# Patient Record
Sex: Male | Born: 1990 | Race: White | Hispanic: No | Smoking: Current every day smoker
Health system: Southern US, Community
[De-identification: ages and names within clinical notes are randomized; demographics above are authoritative.]

## PROBLEM LIST (undated history)

## (undated) HISTORY — PX: ANKLE FRACTURE SURGERY: SHX122

---

## 2020-10-27 ENCOUNTER — Emergency Department (HOSPITAL_COMMUNITY)
Admission: EM | Admit: 2020-10-27 | Discharge: 2020-10-28 | Disposition: A | Discharge status: Home / Self Care | Payer: Self-pay | Attending: Emergency Medicine | Admitting: Emergency Medicine

## 2020-10-27 ENCOUNTER — Emergency Department (HOSPITAL_COMMUNITY): Payer: Self-pay

## 2020-10-27 DIAGNOSIS — Y908 Blood alcohol level of 240 mg/100 ml or more: Secondary | ICD-10-CM | POA: Insufficient documentation

## 2020-10-27 DIAGNOSIS — S61422A Laceration with foreign body of left hand, initial encounter: Secondary | ICD-10-CM | POA: Insufficient documentation

## 2020-10-27 DIAGNOSIS — T24232A Burn of second degree of left lower leg, initial encounter: Secondary | ICD-10-CM

## 2020-10-27 DIAGNOSIS — S92902A Unspecified fracture of left foot, initial encounter for closed fracture: Secondary | ICD-10-CM | POA: Insufficient documentation

## 2020-10-27 DIAGNOSIS — T07XXXA Unspecified multiple injuries, initial encounter: Secondary | ICD-10-CM | POA: Insufficient documentation

## 2020-10-27 DIAGNOSIS — Z20822 Contact with and (suspected) exposure to covid-19: Secondary | ICD-10-CM | POA: Insufficient documentation

## 2020-10-27 DIAGNOSIS — F10929 Alcohol use, unspecified with intoxication, unspecified: Secondary | ICD-10-CM | POA: Insufficient documentation

## 2020-10-27 DIAGNOSIS — R079 Chest pain, unspecified: Secondary | ICD-10-CM | POA: Insufficient documentation

## 2020-10-27 DIAGNOSIS — Z23 Encounter for immunization: Secondary | ICD-10-CM | POA: Insufficient documentation

## 2020-10-27 MED ORDER — SODIUM CHLORIDE 0.9 % IV SOLN: INTRAVENOUS | Status: DC

## 2020-10-27 MED ORDER — SODIUM CHLORIDE 0.9 % IV BOLUS
1000.0000 mL | Freq: Once | INTRAVENOUS | Status: AC
  Administered 2020-10-28: 1000 mL via INTRAVENOUS

## 2020-10-27 MED ORDER — MIDAZOLAM HCL 2 MG/2ML IJ SOLN
2.0000 mg | Freq: Once | INTRAMUSCULAR | Status: AC
  Administered 2020-10-28: 2 mg via INTRAVENOUS
  Filled 2020-10-27: qty 2

## 2020-10-27 MED ORDER — TETANUS-DIPHTH-ACELL PERTUSSIS 5-2.5-18.5 LF-MCG/0.5 IM SUSY
0.5000 mL | PREFILLED_SYRINGE | Freq: Once | INTRAMUSCULAR | Status: AC
  Administered 2020-10-28: 0.5 mL via INTRAMUSCULAR
  Filled 2020-10-27: qty 0.5
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
## 2020-10-28 ENCOUNTER — Emergency Department (HOSPITAL_COMMUNITY): Payer: Self-pay

## 2020-10-28 ENCOUNTER — Encounter (HOSPITAL_COMMUNITY): Payer: Self-pay | Admitting: Emergency Medicine

## 2020-10-28 LAB — CBC
HCT: 41.7 % (ref 39.0–52.0)
Hemoglobin: 14.4 g/dL (ref 13.0–17.0)
MCH: 30.1 pg (ref 26.0–34.0)
MCHC: 34.5 g/dL (ref 30.0–36.0)
MCV: 87.1 fL (ref 80.0–100.0)
Platelets: 317 10*3/uL (ref 150–400)
RBC: 4.79 MIL/uL (ref 4.22–5.81)
RDW: 13 % (ref 11.5–15.5)
WBC: 16.2 10*3/uL — ABNORMAL HIGH (ref 4.0–10.5)
nRBC: 0 % (ref 0.0–0.2)

## 2020-10-28 LAB — COMPREHENSIVE METABOLIC PANEL
ALT: 137 U/L — ABNORMAL HIGH (ref 0–44)
AST: 127 U/L — ABNORMAL HIGH (ref 15–41)
Albumin: 4.2 g/dL (ref 3.5–5.0)
Alkaline Phosphatase: 47 U/L (ref 38–126)
Anion gap: 12 (ref 5–15)
BUN: 12 mg/dL (ref 6–20)
CO2: 22 mmol/L (ref 22–32)
Calcium: 8.8 mg/dL — ABNORMAL LOW (ref 8.9–10.3)
Chloride: 108 mmol/L (ref 98–111)
Creatinine, Ser: 1.1 mg/dL (ref 0.61–1.24)
GFR, Estimated: >60 mL/min (ref >60)
Glucose, Bld: 117 mg/dL — ABNORMAL HIGH (ref 70–99)
Potassium: 3.3 mmol/L — ABNORMAL LOW (ref 3.5–5.1)
Sodium: 142 mmol/L (ref 135–145)
Total Bilirubin: 0.4 mg/dL (ref 0.3–1.2)
Total Protein: 7.5 g/dL (ref 6.5–8.1)

## 2020-10-28 LAB — I-STAT CHEM 8, ED
BUN: 12 mg/dL (ref 6–20)
Calcium, Ion: 1.08 mmol/L — ABNORMAL LOW (ref 1.15–1.40)
Chloride: 106 mmol/L (ref 98–111)
Creatinine, Ser: 1.4 mg/dL — ABNORMAL HIGH (ref 0.61–1.24)
Glucose, Bld: 113 mg/dL — ABNORMAL HIGH (ref 70–99)
HCT: 42 % (ref 39.0–52.0)
Hemoglobin: 14.3 g/dL (ref 13.0–17.0)
Potassium: 3.1 mmol/L — ABNORMAL LOW (ref 3.5–5.1)
Sodium: 144 mmol/L (ref 135–145)
TCO2: 23 mmol/L (ref 22–32)

## 2020-10-28 LAB — ETHANOL: Alcohol, Ethyl (B): 250 mg/dL — ABNORMAL HIGH (ref <10)

## 2020-10-28 LAB — PROTIME-INR
INR: 1.1 (ref 0.8–1.2)
Prothrombin Time: 13.9 seconds (ref 11.4–15.2)

## 2020-10-28 LAB — RESP PANEL BY RT-PCR (FLU A&B, COVID) ARPGX2
Influenza A by PCR: NEGATIVE
Influenza B by PCR: NEGATIVE
SARS Coronavirus 2 by RT PCR: NEGATIVE

## 2020-10-28 MED ORDER — CEPHALEXIN 250 MG PO CAPS
500.0000 mg | ORAL_CAPSULE | Freq: Once | ORAL | Status: AC
  Administered 2020-10-28: 500 mg via ORAL
  Filled 2020-10-28: qty 2

## 2020-10-28 MED ORDER — ACETAMINOPHEN 500 MG PO TABS
1000.0000 mg | ORAL_TABLET | Freq: Once | ORAL | Status: AC
  Administered 2020-10-28: 1000 mg via ORAL
  Filled 2020-10-28: qty 2

## 2020-10-28 MED ORDER — CEPHALEXIN 500 MG PO CAPS: 500.0000 mg | ORAL_CAPSULE | Freq: Three times a day (TID) | ORAL | 0 refills | Status: AC

## 2020-10-28 MED ORDER — IOHEXOL 350 MG/ML SOLN
100.0000 mL | Freq: Once | INTRAVENOUS | Status: AC | PRN
  Administered 2020-10-28: 100 mL via INTRAVENOUS

## 2020-10-28 MED ORDER — LORAZEPAM 2 MG/ML IJ SOLN
1.0000 mg | Freq: Once | INTRAMUSCULAR | Status: AC
  Administered 2020-10-28: 1 mg via INTRAVENOUS
  Filled 2020-10-28: qty 1

## 2020-10-28 MED ORDER — HALOPERIDOL LACTATE 5 MG/ML IJ SOLN
5.0000 mg | Freq: Once | INTRAMUSCULAR | Status: AC
  Administered 2020-10-28: 5 mg via INTRAVENOUS
  Filled 2020-10-28: qty 1

## 2020-10-28 MED ORDER — LIDOCAINE-EPINEPHRINE (PF) 2 %-1:200000 IJ SOLN
20.0000 mL | Freq: Once | INTRAMUSCULAR | Status: AC
  Administered 2020-10-28: 20 mL via INTRADERMAL
  Filled 2020-10-28: qty 20

## 2020-10-28 MED ORDER — KETOROLAC TROMETHAMINE 15 MG/ML IJ SOLN
7.5000 mg | Freq: Once | INTRAMUSCULAR | Status: AC
  Administered 2020-10-28: 7.5 mg via INTRAVENOUS
  Filled 2020-10-28: qty 1

## 2020-10-28 NOTE — ED Notes (Signed)
CT begun

## 2020-10-28 NOTE — ED Notes (Signed)
EDP at bedside for suture process
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
## 2020-10-28 NOTE — ED Notes (Signed)
Pt found attempting to stand at the foot edge of his bed again, confused and demanding to leave the hospital. Assisted back into bed by ED clinical staff.

## 2020-10-28 NOTE — ED Notes (Signed)
Restraints removed at this time. Pt calm and cooperative. Pt using phone to contact a ride.

## 2020-10-28 NOTE — Progress Notes (Signed)
Orthopedic Tech Progress Note Patient Details:  Garrett [name] [birth year] [id number]  Ortho Devices Type of Ortho Device: [name] (short leg) splint, Stirrup splint Ortho Device/Splint Location: lle Ortho Device/Splint Interventions: Ordered, Application, Adjustment  I had assistance from the RN. We dressed the wounds on his foot with xeroform and nonstick dressing. The patient was kicking and swinging his leg the whole time. [name] Interventions Patient Tolerated: Well Instructions Provided: Care of device, Adjustment of device  Garrett [name] 10/28/2020, 5:29 AM

## 2020-10-28 NOTE — ED Notes (Signed)
Patient Alert and oriented to baseline. Stable and ambulatory to baseline. Patient verbalized understanding of the discharge instructions.  Patient belongings were taken by the patient.   
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
## 2020-10-28 NOTE — ED Notes (Signed)
Pt found standing at the edge of his bed despite numerous reminders to remain in bed. Assisted back into bed by ED staff.
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
## 2020-10-28 NOTE — ED Notes (Signed)
Pt very agitated, sitting up in bed despite repeated instruction to continue laying flat for the time being. Stated to this RN and ED Provider, "I am telling you right now, do not f*cking touch me."
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
## 2020-10-28 NOTE — ED Notes (Signed)
RN called ortho technician for leg splint

## 2020-10-30 ENCOUNTER — Ambulatory Visit (HOSPITAL_BASED_OUTPATIENT_CLINIC_OR_DEPARTMENT_OTHER)
Admission: RE | Admit: 2020-10-30 | Discharge: 2020-10-30 | Disposition: A | Discharge status: Home / Self Care | Payer: Self-pay | Source: Ambulatory Visit | Attending: Orthopaedic Surgery | Admitting: Orthopaedic Surgery

## 2020-10-30 ENCOUNTER — Other Ambulatory Visit (HOSPITAL_BASED_OUTPATIENT_CLINIC_OR_DEPARTMENT_OTHER): Payer: Self-pay

## 2020-10-30 ENCOUNTER — Ambulatory Visit (INDEPENDENT_AMBULATORY_CARE_PROVIDER_SITE_OTHER): Payer: Self-pay | Admitting: Orthopaedic Surgery

## 2020-10-30 ENCOUNTER — Other Ambulatory Visit: Payer: Self-pay

## 2020-10-30 VITALS — Ht 68.0 in | Wt 190.0 lb

## 2020-10-30 DIAGNOSIS — M25572 Pain in left ankle and joints of left foot: Secondary | ICD-10-CM | POA: Insufficient documentation

## 2020-10-30 MED ORDER — OXYCODONE HCL 5 MG PO CAPS: 5.0000 mg | ORAL_CAPSULE | ORAL | 0 refills | Status: DC | PRN

## 2020-10-30 MED ORDER — OXYCODONE HCL 5 MG PO TABS
5.0000 mg | ORAL_TABLET | ORAL | 0 refills | Status: DC | PRN
  Filled 2020-10-30: qty 20, 4d supply, fill #0

## 2020-10-30 NOTE — Addendum Note (Signed)
Addended by: Benancio Deeds on: 10/30/2020 05:37 PM   Modules accepted: Orders
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
## 2020-10-31 ENCOUNTER — Telehealth: Payer: Self-pay | Admitting: Orthopaedic Surgery

## 2020-10-31 NOTE — Telephone Encounter (Signed)
Please provide work status and work note. Disability forms received. [employer] will need work note to complete forms. Thanks.

## 2020-11-01 ENCOUNTER — Telehealth: Payer: Self-pay | Admitting: Orthopaedic Surgery

## 2020-11-01 NOTE — Telephone Encounter (Signed)
Patient called asked if he can be called with the results of his CT scan? Patient said he does not have anyway to come into the facility to get his results. The number to contact patient is [phone]

## 2020-11-02 ENCOUNTER — Telehealth: Payer: Self-pay | Admitting: Orthopaedic Surgery

## 2020-11-02 ENCOUNTER — Other Ambulatory Visit (HOSPITAL_BASED_OUTPATIENT_CLINIC_OR_DEPARTMENT_OTHER): Payer: Self-pay | Admitting: Orthopaedic Surgery

## 2020-11-02 ENCOUNTER — Other Ambulatory Visit (HOSPITAL_BASED_OUTPATIENT_CLINIC_OR_DEPARTMENT_OTHER): Payer: Self-pay

## 2020-11-02 MED ORDER — OXYCODONE HCL 5 MG PO TABS
5.0000 mg | ORAL_TABLET | ORAL | 0 refills | Status: AC | PRN
  Filled 2020-11-02: qty 20, 4d supply, fill #0

## 2020-11-02 NOTE — Telephone Encounter (Signed)
Patient called. He would like a refill on oxycodone called in. His call back number is [phone]

## 2020-11-08 ENCOUNTER — Telehealth: Payer: Self-pay | Admitting: Orthopaedic Surgery

## 2020-11-08 ENCOUNTER — Other Ambulatory Visit (HOSPITAL_BASED_OUTPATIENT_CLINIC_OR_DEPARTMENT_OTHER): Payer: Self-pay | Admitting: Orthopaedic Surgery

## 2020-11-08 ENCOUNTER — Other Ambulatory Visit (HOSPITAL_BASED_OUTPATIENT_CLINIC_OR_DEPARTMENT_OTHER): Payer: Self-pay

## 2020-11-08 MED ORDER — MELOXICAM 15 MG PO TABS
15.0000 mg | ORAL_TABLET | Freq: Every day | ORAL | 3 refills | Status: AC
  Filled 2020-11-08: qty 30, 30d supply, fill #0

## 2020-11-08 NOTE — Telephone Encounter (Signed)
Pt called asking for a refill of his oxycodone rx 5 mg.   [phone]

## 2020-11-09 ENCOUNTER — Telehealth: Payer: Self-pay | Admitting: Orthopaedic Surgery

## 2020-11-09 NOTE — Telephone Encounter (Signed)
Pt submitted medical release form, short term disability forms, and $25.00 money order. Accepted 11/09/2020

## 2020-11-16 ENCOUNTER — Other Ambulatory Visit: Payer: Self-pay

## 2020-11-16 ENCOUNTER — Ambulatory Visit (INDEPENDENT_AMBULATORY_CARE_PROVIDER_SITE_OTHER): Payer: Self-pay | Admitting: Orthopaedic Surgery

## 2020-11-16 ENCOUNTER — Other Ambulatory Visit (HOSPITAL_BASED_OUTPATIENT_CLINIC_OR_DEPARTMENT_OTHER): Payer: Self-pay | Admitting: Orthopaedic Surgery

## 2020-11-16 DIAGNOSIS — S93325A Dislocation of tarsometatarsal joint of left foot, initial encounter: Secondary | ICD-10-CM

## 2020-11-16 DIAGNOSIS — M25572 Pain in left ankle and joints of left foot: Secondary | ICD-10-CM
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
## 2020-11-19 ENCOUNTER — Other Ambulatory Visit (HOSPITAL_BASED_OUTPATIENT_CLINIC_OR_DEPARTMENT_OTHER): Payer: Self-pay

## 2020-12-14 ENCOUNTER — Ambulatory Visit (HOSPITAL_BASED_OUTPATIENT_CLINIC_OR_DEPARTMENT_OTHER): Payer: Self-pay | Admitting: Orthopaedic Surgery

## 2020-12-14 ENCOUNTER — Other Ambulatory Visit (HOSPITAL_BASED_OUTPATIENT_CLINIC_OR_DEPARTMENT_OTHER): Payer: Self-pay | Admitting: Orthopaedic Surgery

## 2020-12-14 DIAGNOSIS — M25572 Pain in left ankle and joints of left foot: Secondary | ICD-10-CM

## 2020-12-18 ENCOUNTER — Ambulatory Visit (HOSPITAL_BASED_OUTPATIENT_CLINIC_OR_DEPARTMENT_OTHER): Payer: Self-pay | Admitting: Orthopaedic Surgery

## 2020-12-20 ENCOUNTER — Ambulatory Visit (INDEPENDENT_AMBULATORY_CARE_PROVIDER_SITE_OTHER): Payer: Self-pay | Admitting: Orthopaedic Surgery

## 2020-12-20 ENCOUNTER — Other Ambulatory Visit: Payer: Self-pay

## 2020-12-20 ENCOUNTER — Ambulatory Visit (HOSPITAL_BASED_OUTPATIENT_CLINIC_OR_DEPARTMENT_OTHER)
Admission: RE | Admit: 2020-12-20 | Discharge: 2020-12-20 | Disposition: A | Discharge status: Home / Self Care | Payer: Self-pay | Source: Ambulatory Visit | Attending: Orthopaedic Surgery | Admitting: Orthopaedic Surgery

## 2020-12-20 ENCOUNTER — Other Ambulatory Visit (HOSPITAL_BASED_OUTPATIENT_CLINIC_OR_DEPARTMENT_OTHER): Payer: Self-pay

## 2020-12-20 DIAGNOSIS — M25572 Pain in left ankle and joints of left foot: Secondary | ICD-10-CM | POA: Insufficient documentation

## 2020-12-20 DIAGNOSIS — S93325A Dislocation of tarsometatarsal joint of left foot, initial encounter: Secondary | ICD-10-CM

## 2020-12-20 MED ORDER — SILVER SULFADIAZINE 1 % EX CREA
1.0000 "application " | TOPICAL_CREAM | Freq: Every day | CUTANEOUS | 0 refills | Status: AC
  Filled 2020-12-20: qty 25, 5d supply, fill #0
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
## 2021-03-07 ENCOUNTER — Encounter (HOSPITAL_BASED_OUTPATIENT_CLINIC_OR_DEPARTMENT_OTHER): Payer: Self-pay | Admitting: Emergency Medicine

## 2021-03-07 ENCOUNTER — Other Ambulatory Visit (HOSPITAL_BASED_OUTPATIENT_CLINIC_OR_DEPARTMENT_OTHER): Payer: Self-pay

## 2021-03-07 ENCOUNTER — Emergency Department (HOSPITAL_BASED_OUTPATIENT_CLINIC_OR_DEPARTMENT_OTHER): Payer: Self-pay

## 2021-03-07 ENCOUNTER — Emergency Department (HOSPITAL_BASED_OUTPATIENT_CLINIC_OR_DEPARTMENT_OTHER)
Admission: EM | Admit: 2021-03-07 | Discharge: 2021-03-07 | Disposition: A | Discharge status: Home / Self Care | Payer: Self-pay | Attending: Emergency Medicine | Admitting: Emergency Medicine

## 2021-03-07 DIAGNOSIS — K047 Periapical abscess without sinus: Secondary | ICD-10-CM | POA: Insufficient documentation

## 2021-03-07 DIAGNOSIS — F17210 Nicotine dependence, cigarettes, uncomplicated: Secondary | ICD-10-CM | POA: Insufficient documentation

## 2021-03-07 DIAGNOSIS — K029 Dental caries, unspecified: Secondary | ICD-10-CM | POA: Insufficient documentation

## 2021-03-07 DIAGNOSIS — Z79899 Other long term (current) drug therapy: Secondary | ICD-10-CM | POA: Insufficient documentation

## 2021-03-07 LAB — BASIC METABOLIC PANEL
Anion gap: 11 (ref 5–15)
BUN: 15 mg/dL (ref 6–20)
CO2: 27 mmol/L (ref 22–32)
Calcium: 8.9 mg/dL (ref 8.9–10.3)
Chloride: 97 mmol/L — ABNORMAL LOW (ref 98–111)
Creatinine, Ser: 1.02 mg/dL (ref 0.61–1.24)
GFR, Estimated: >60 mL/min (ref >60)
Glucose, Bld: 96 mg/dL (ref 70–99)
Potassium: 3.2 mmol/L — ABNORMAL LOW (ref 3.5–5.1)
Sodium: 135 mmol/L (ref 135–145)

## 2021-03-07 LAB — CBC WITH DIFFERENTIAL/PLATELET
Abs Immature Granulocytes: 0.03 10*3/uL (ref 0.00–0.07)
Basophils Absolute: 0 10*3/uL (ref 0.0–0.1)
Basophils Relative: 0 %
Eosinophils Absolute: 0.5 10*3/uL (ref 0.0–0.5)
Eosinophils Relative: 4 %
HCT: 42.1 % (ref 39.0–52.0)
Hemoglobin: 14.9 g/dL (ref 13.0–17.0)
Immature Granulocytes: 0 %
Lymphocytes Relative: 18 %
Lymphs Abs: 2.4 10*3/uL (ref 0.7–4.0)
MCH: 30.3 pg (ref 26.0–34.0)
MCHC: 35.4 g/dL (ref 30.0–36.0)
MCV: 85.7 fL (ref 80.0–100.0)
Monocytes Absolute: 1.7 10*3/uL — ABNORMAL HIGH (ref 0.1–1.0)
Monocytes Relative: 13 %
Neutro Abs: 8.6 10*3/uL — ABNORMAL HIGH (ref 1.7–7.7)
Neutrophils Relative %: 65 %
Platelets: 338 10*3/uL (ref 150–400)
RBC: 4.91 MIL/uL (ref 4.22–5.81)
RDW: 13.3 % (ref 11.5–15.5)
WBC: 13.2 10*3/uL — ABNORMAL HIGH (ref 4.0–10.5)
nRBC: 0 % (ref 0.0–0.2)

## 2021-03-07 LAB — GROUP A STREP BY PCR: Group A Strep by PCR: NOT DETECTED

## 2021-03-07 MED ORDER — HYDROCODONE-ACETAMINOPHEN 5-325 MG PO TABS
1.0000 | ORAL_TABLET | ORAL | 0 refills | Status: AC | PRN
Start: 2021-03-07 — End: ?
  Filled 2021-03-07: qty 10, 2d supply, fill #0

## 2021-03-07 MED ORDER — AMPICILLIN-SULBACTAM SODIUM 3 (2-1) G IJ SOLR
3.0000 g | Freq: Once | INTRAMUSCULAR | Status: AC
  Administered 2021-03-07: 3 g via INTRAVENOUS
  Filled 2021-03-07: qty 8

## 2021-03-07 MED ORDER — FENTANYL CITRATE PF 50 MCG/ML IJ SOSY
100.0000 ug | PREFILLED_SYRINGE | Freq: Once | INTRAMUSCULAR | Status: AC
  Administered 2021-03-07: 100 ug via INTRAVENOUS
  Filled 2021-03-07: qty 2

## 2021-03-07 MED ORDER — IOHEXOL 300 MG/ML  SOLN
75.0000 mL | Freq: Once | INTRAMUSCULAR | Status: AC | PRN
  Administered 2021-03-07: 75 mL via INTRAVENOUS

## 2021-03-07 MED ORDER — ONDANSETRON HCL 4 MG/2ML IJ SOLN
4.0000 mg | Freq: Once | INTRAMUSCULAR | Status: AC
  Administered 2021-03-07: 4 mg via INTRAVENOUS
  Filled 2021-03-07: qty 2

## 2021-03-07 MED ORDER — AMOXICILLIN-POT CLAVULANATE 875-125 MG PO TABS
1.0000 | ORAL_TABLET | Freq: Two times a day (BID) | ORAL | 0 refills | Status: AC
  Filled 2021-03-07: qty 14, 7d supply, fill #0

## 2021-03-07 MED ORDER — CHLORHEXIDINE GLUCONATE 0.12 % MT SOLN
15.0000 mL | Freq: Two times a day (BID) | OROMUCOSAL | 0 refills | Status: AC
  Filled 2021-03-07: qty 473, 16d supply, fill #0

## 2021-03-07 MED ORDER — SODIUM CHLORIDE 0.9 % IV SOLN: Freq: Once | INTRAVENOUS | Status: AC

## 2021-03-07 NOTE — ED Provider Notes (Signed)
[facility]-EMERGENCY DEPT Provider Note: Lowella Dell, MD, FACEP  CSN: [id number] MRN: [id number] ARRIVAL: 03/07/21 at 0526 ROOM: [facility]09/[facility]09   CHIEF COMPLAINT  Oral Swelling   HISTORY OF PRESENT ILLNESS  03/07/21 5:44 AM Garrett [name] is a 31 y.o. male who has a carious right lower second molar and a fractured right lower third molar.  He has an appointment with a dentist this morning at 8:30 AM.  He is here with pain and swelling that began 2 days ago underneath the right side of the mandible.  He has developed worsening pain which she now rates as a 10 out of 10.  He states it feels difficult to swallow and he feels like he is having difficulty breathing through his mouth though not through his nose.  Symptoms are worse when lying flat and improved when sitting upright.   History reviewed. No pertinent past medical history.  Past Surgical History:  Procedure Laterality Date   ANKLE FRACTURE SURGERY Left     History reviewed. No pertinent family history.  Social History   Tobacco Use   Smoking status: Every Day    Types: Cigarettes   Smokeless tobacco: Never  Substance Use Topics   Alcohol use: Yes   Drug use: Never    Prior to Admission medications   Medication Sig Start Date End Date Taking? Authorizing Provider  amoxicillin-clavulanate (AUGMENTIN) 875-125 MG tablet Take 1 tablet by mouth every 12 (twelve) hours. 03/07/21  Yes Jacalyn Lefevre, MD  chlorhexidine (PERIDEX) 0.12 % solution Use as directed 15 mLs in the mouth or throat 2 (two) times daily. 03/07/21  Yes Jacalyn Lefevre, MD  HYDROcodone-acetaminophen (NORCO/VICODIN) 5-325 MG tablet Take 1 tablet by mouth every 4 (four) hours as needed. 03/07/21  Yes Jacalyn Lefevre, MD  meloxicam (MOBIC) 15 MG tablet Take 1 tablet (15 mg total) by mouth daily. 11/08/20   Huel Cote, MD  oxyCODONE (OXY IR/ROXICODONE) 5 MG immediate release tablet Take 1 tablet (5 mg total) by mouth every 4 (four) hours as needed (severe  pain). 11/02/20   Huel Cote, MD  silver sulfADIAZINE (SILVADENE) 1 % cream Apply 1 application topically daily. 12/20/20   Huel Cote, MD    Allergies Vancomycin   REVIEW OF SYSTEMS  Negative except as noted here or in the History of Present Illness.   PHYSICAL EXAMINATION  Initial Vital Signs Blood pressure (!) 141/81, pulse (!) 110, temperature 99 F (37.2 C), temperature source Axillary, resp. rate (!) 24, height 5\' 8"  (1.727 m), weight 88.5 kg, SpO2 100 %.  Examination General: Well-developed, well-nourished male in no acute distress; appearance consistent with age of record HENT: normocephalic; atraumatic; carious right lower second molar, fractured right lower third molar; uvula midline; no dysphonia; no stridor; submandibular soft tissue tenderness and swelling Eyes: Normal appearance Neck: supple Heart: regular rate and rhythm; tachycardic Lungs: clear to auscultation bilaterally Abdomen: soft; nondistended; nontender; bowel sounds present Extremities: No deformity; full range of motion; pulses normal Neurologic: Awake, alert and oriented; motor function intact in all extremities and symmetric; no facial droop Skin: Warm and dry Psychiatric: Anxious   RESULTS  Summary of this visit's results, reviewed and interpreted by myself:   EKG Interpretation  Date/Time:    Ventricular Rate:    PR Interval:    QRS Duration:   QT Interval:    QTC Calculation:   R Axis:     Text Interpretation:         Laboratory Studies: Results for orders placed or  performed during the hospital encounter of 03/07/21 (from the past 24 hour(s))  Group A Strep by PCR     Status: None   Collection Time: 03/07/21  5:33 AM   Specimen: Throat; Sterile Swab  Result Value Ref Range   Group A Strep by PCR NOT DETECTED NOT DETECTED  CBC with Differential/Platelet     Status: Abnormal   Collection Time: 03/07/21  6:02 AM  Result Value Ref Range   WBC 13.2 (H) 4.0 - 10.5 K/uL    RBC 4.91 4.22 - 5.81 MIL/uL   Hemoglobin 14.9 13.0 - 17.0 g/dL   HCT 28.0 03.4 - 91.7 %   MCV 85.7 80.0 - 100.0 fL   MCH 30.3 26.0 - 34.0 pg   MCHC 35.4 30.0 - 36.0 g/dL   RDW 91.5 05.6 - 97.9 %   Platelets 338 150 - 400 K/uL   nRBC 0.0 0.0 - 0.2 %   Neutrophils Relative % 65 %   Neutro Abs 8.6 (H) 1.7 - 7.7 K/uL   Lymphocytes Relative 18 %   Lymphs Abs 2.4 0.7 - 4.0 K/uL   Monocytes Relative 13 %   Monocytes Absolute 1.7 (H) 0.1 - 1.0 K/uL   Eosinophils Relative 4 %   Eosinophils Absolute 0.5 0.0 - 0.5 K/uL   Basophils Relative 0 %   Basophils Absolute 0.0 0.0 - 0.1 K/uL   Immature Granulocytes 0 %   Abs Immature Granulocytes 0.03 0.00 - 0.07 K/uL  Basic metabolic panel     Status: Abnormal   Collection Time: 03/07/21  6:02 AM  Result Value Ref Range   Sodium 135 135 - 145 mmol/L   Potassium 3.2 (L) 3.5 - 5.1 mmol/L   Chloride 97 (L) 98 - 111 mmol/L   CO2 27 22 - 32 mmol/L   Glucose, Bld 96 70 - 99 mg/dL   BUN 15 6 - 20 mg/dL   Creatinine, Ser 4.80 0.61 - 1.24 mg/dL   Calcium 8.9 8.9 - 16.5 mg/dL   GFR, Estimated >53 >74 mL/min   Anion gap 11 5 - 15   Imaging Studies: DG Neck Soft Tissue  Result Date: 03/07/2021 CLINICAL DATA:  31 year old male with history of throat pain and swelling. EXAM: NECK SOFT TISSUES - 1+ VIEW COMPARISON:  No priors. FINDINGS: Prominence of soft tissues in the posterior oropharynx, presumably enlarged tonsils/adenoids. There is no evidence of retropharyngeal soft tissue swelling or epiglottic enlargement. The cervical airway is unremarkable and no radio-opaque foreign body identified. IMPRESSION: 1. Prominent soft tissues in the posterior oropharynx which may reflect enlarged tonsils and/or adenoids. Electronically Signed   By: Trudie Reed M.D.   On: 03/07/2021 05:52   CT Soft Tissue Neck W Contrast  Result Date: 03/07/2021 CLINICAL DATA:  Soft tissue swelling with infection suspected EXAM: CT NECK WITH CONTRAST TECHNIQUE: Multidetector CT  imaging of the neck was performed using the standard protocol following the bolus administration of intravenous contrast. RADIATION DOSE REDUCTION: This exam was performed according to the departmental dose-optimization program which includes automated exposure control, adjustment of the mA and/or kV according to patient size and/or use of iterative reconstruction technique. CONTRAST:  46mL OMNIPAQUE IOHEXOL 300 MG/ML  SOLN COMPARISON:  None. FINDINGS: Pharynx and larynx: Soft tissue swelling in the right submandibular space with low-density edematous appearance exerting mass effect on the right submandibular gland and floor of mouth. Presumed dental cause from tooth 31 or 32 which are both eroded with periapical erosion. Tooth 32 periapical erosion extends  more medially. No rim enhancing collection for drainage. Negative pharynx and larynx Salivary glands: No primary inflammation suspected. Thyroid: Normal. Lymph nodes: Reactive nodal enlargement on the right without cavitation. Vascular: Negative. Limited intracranial: Negative. Visualized orbits: Negative Mastoids and visualized paranasal sinuses: Clear Skeleton: No acute or aggressive finding Upper chest: Negative IMPRESSION: Right submandibular space inflammation, presumably odontogenic from the carious right mandibular molars. Swelling causes mass effect on the right submandibular gland and floor of mouth; no abscess. Electronically Signed   By: Tiburcio Pea M.D.   On: 03/07/2021 07:08    ED COURSE and MDM  Nursing notes, initial and subsequent vitals signs, including pulse oximetry, reviewed and interpreted by myself.  Vitals:   03/07/21 0800 03/07/21 0900 03/07/21 1000 03/07/21 1055  BP: 128/75 140/81 (!) 143/87 (!) 156/94  Pulse: 84 62 70 83  Resp: 18 18 18 18   Temp:    99 F (37.2 C)  TempSrc:    Oral  SpO2: 96% 96% 98% 97%  Weight:      Height:       Medications  ondansetron (ZOFRAN) injection 4 mg (4 mg Intravenous Given 03/07/21  0555)  fentaNYL (SUBLIMAZE) injection 100 mcg (100 mcg Intravenous Given 03/07/21 0557)  iohexol (OMNIPAQUE) 300 MG/ML solution 75 mL (75 mLs Intravenous Contrast Given 03/07/21 0631)  0.9 %  sodium chloride infusion (0 mLs Intravenous Stopped 03/07/21 1053)  Ampicillin-Sulbactam (UNASYN) 3 g in sodium chloride 0.9 % 100 mL IVPB (0 g Intravenous Stopped 03/07/21 0753)   6:44 AM Plain radiographs are reassuring concerning airway patency.  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  Patient currently in CT getting scan of the neck with contrast.  Unasyn 3 g ordered for treatment of possible Ludwig's angina but should cover other odontogenic infections as well.  7:00 AM CT results pending.  Signed out to Dr. 03/09/21.  PROCEDURES  Procedures   ED DIAGNOSES     ICD-10-CM   1. Dental caries  K02.9     2. Dental infection  K04.7          [place], Particia Nearing, MD 03/08/21 [phone]

## 2021-03-07 NOTE — ED Triage Notes (Signed)
Pt states has tooth infection and DDS appointment today, woke up with increased swelling and difficulty breathing.

## 2021-03-07 NOTE — ED Notes (Signed)
Pt sitting up in bed, pt states that he is unable to get a ride to [place] for the oral surgeon, states that he is going to try and get one for tomorrow.  Pt verbalized understanding d/c instructions and follow up, reviewed prescriptions.  Pt ambulatory from dpt

## 2021-03-07 NOTE — Discharge Instructions (Addendum)
Return immediately if you have shortness or breath or if the swelling is worse.

## 2021-03-07 NOTE — ED Notes (Signed)
Pt in bed with eyes closed, pt arouses to verbal stim, pt states that he has tried to call to get a ride to the oral surgeon, but no one is answering his phone calls.   Encouraged pt to continue exploring transportation solutions.

## 2021-03-07 NOTE — ED Provider Notes (Signed)
Pt signed out by Dr. Read Drivers pending CT scan:  IMPRESSION: Right submandibular space inflammation, presumably odontogenic from the carious right mandibular molars. Swelling causes mass effect on the right submandibular gland and floor of mouth; no abscess.  I reviewed films and agree with the radiologist.  Pt d/w Dr. Ross Marcus (oral surgery).  He offered to take the teeth out today or tomorrow. However, pt would need to get to his office.  Pt has been trying to find a ride, but has not been able to get ahold of anyone.  He is requesting to go home and will continue to try to get a ride.  Pt does know that he needs to come back if worse.  His airway is intact and he is able to swallow.  He is stable for d/c.  He is to call Dr. Joie Bimler office if he finds a ride.     Jacalyn Lefevre, MD 03/07/21 1040

## 2023-04-26 IMAGING — CT CT NECK W/ CM
4 series · 14 of 33 positions shown, 17 images · IV contrast (agent unspecified)
Comparison: None.

CLINICAL DATA: Soft tissue swelling with infection suspected

EXAM:
CT NECK WITH CONTRAST
TECHNIQUE: Multidetector CT imaging of the neck was performed using the
standard protocol following the bolus administration of intravenous
contrast.

[Series 3: axial neck · axial · 0.57mm/px · z∈[-271,-133]mm · 5 of 105 slices shown, 7 images]
[im 18/105  soft-tissue]
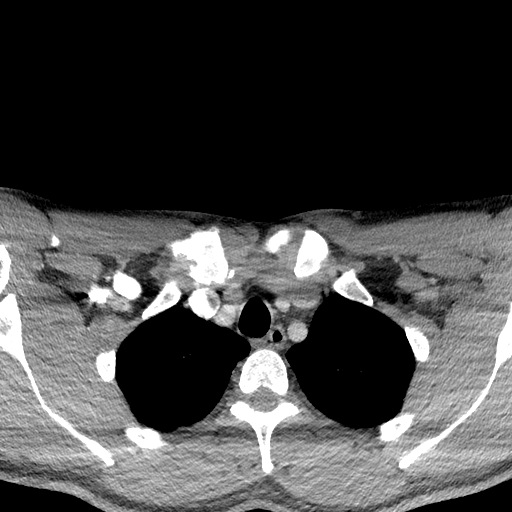
[im 18/105  bone]
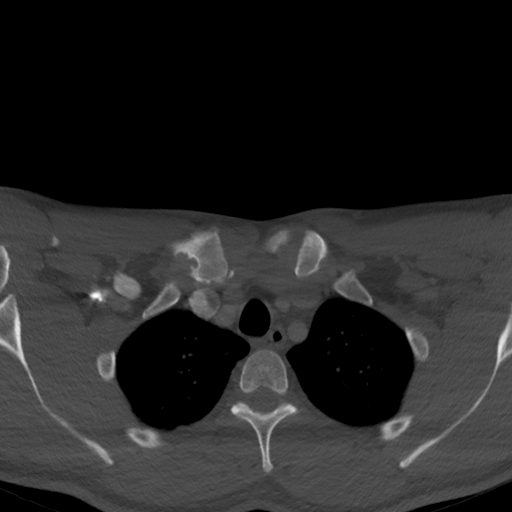
[im 35/105  bone]
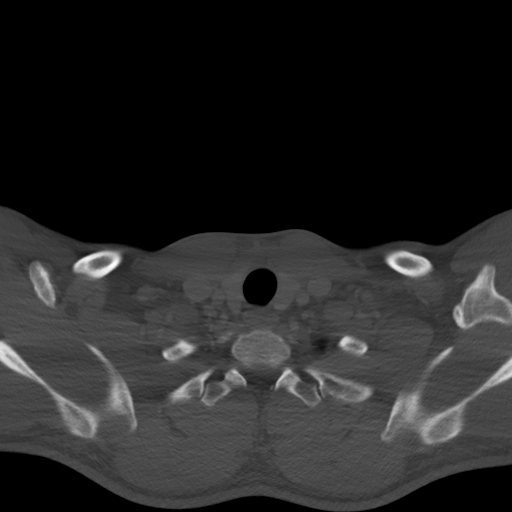
[im 53/105  bone]
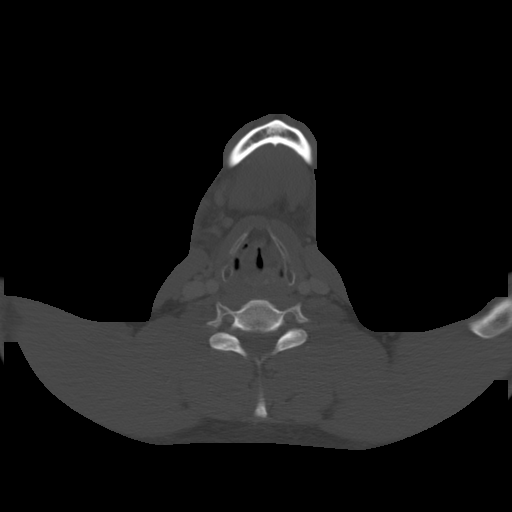
[im 70/105  bone]
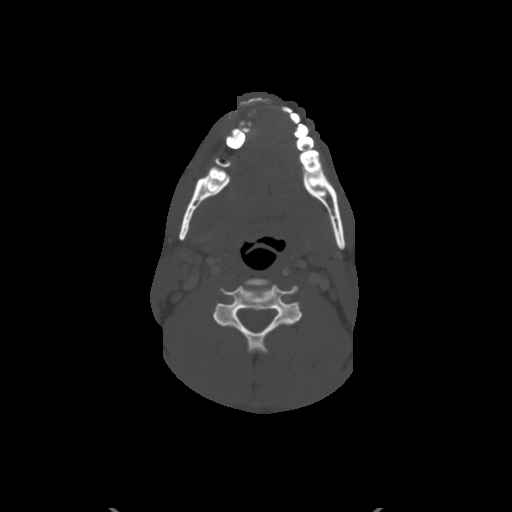
[im 87/105  soft-tissue]
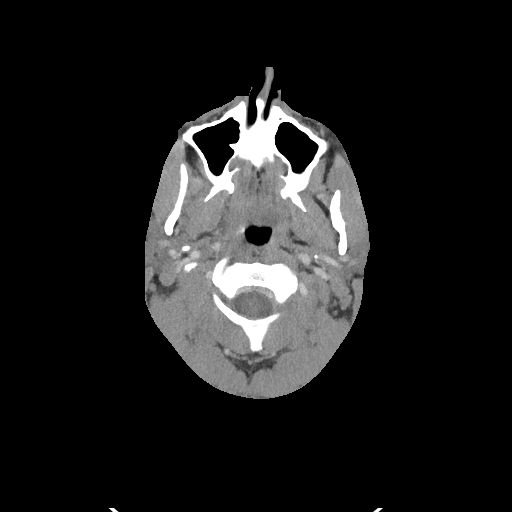
[im 87/105  bone]
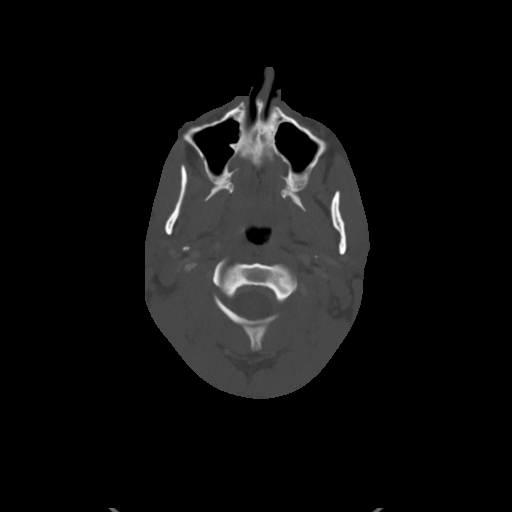

[Series 6: sag neck · sagittal · 0.44mm/px · 5 of 88 slices shown, 6 images]
[im 30/88  bone]
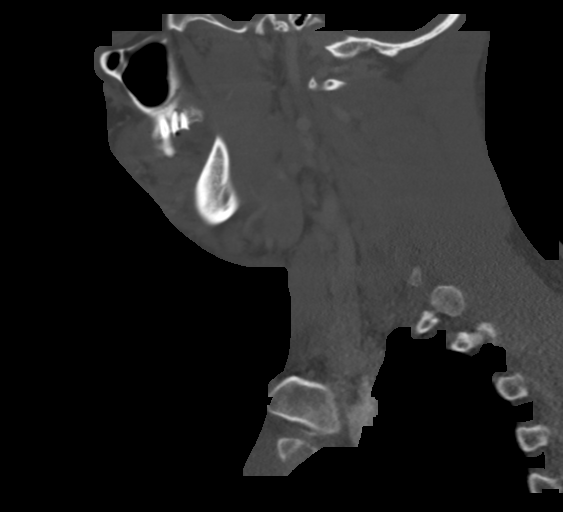
[im 37/88  bone]
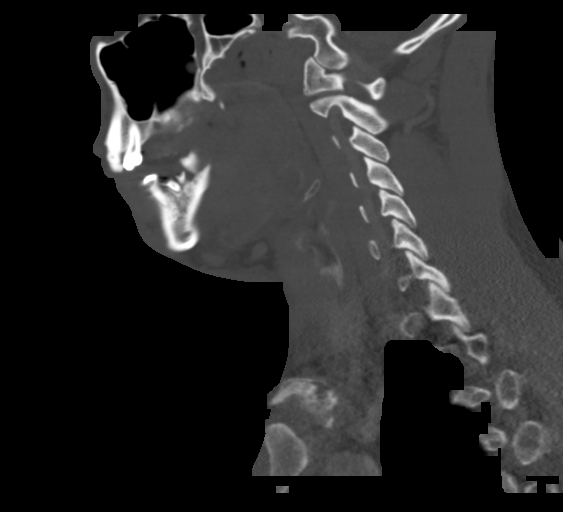
[im 44/88  soft-tissue]
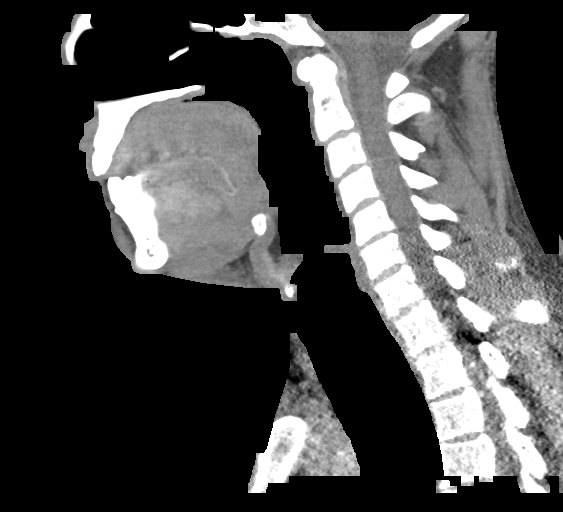
[im 44/88  bone]
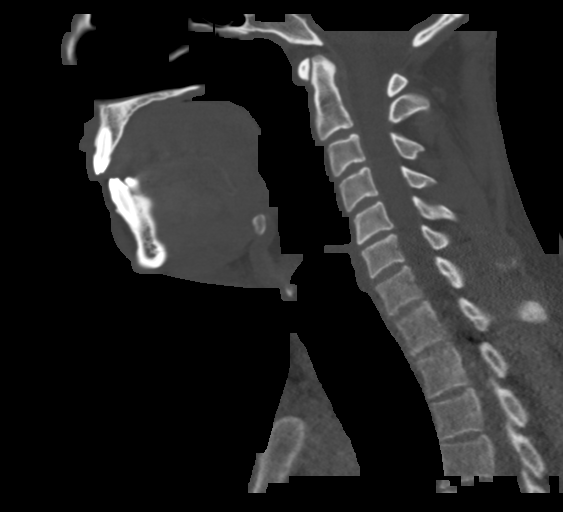
[im 51/88  bone]
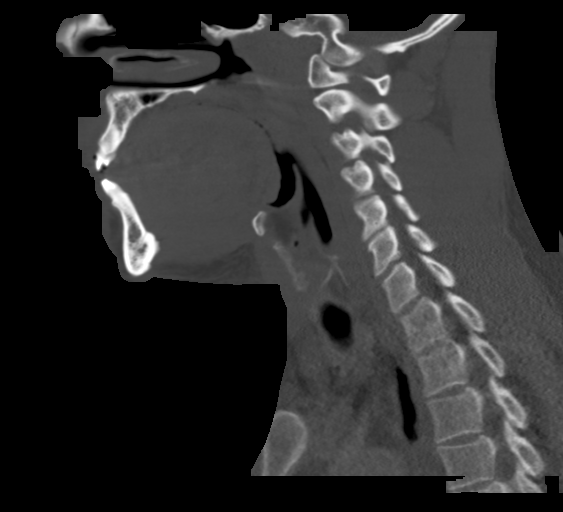
[im 59/88  bone]
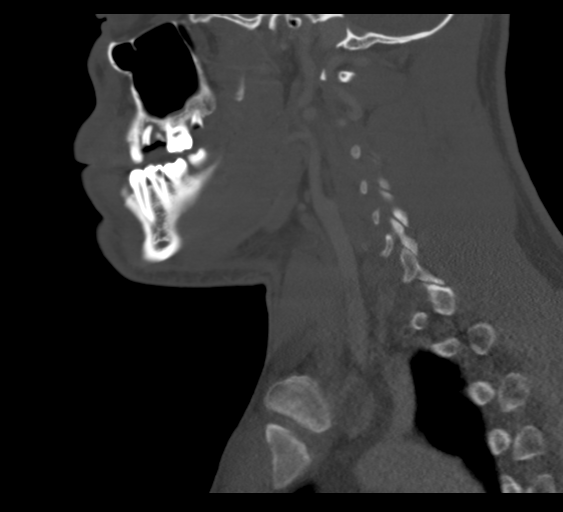

[Series 7: cor neck · coronal · 0.36mm/px · 3 of 126 slices shown]
[im 26/126  bone]
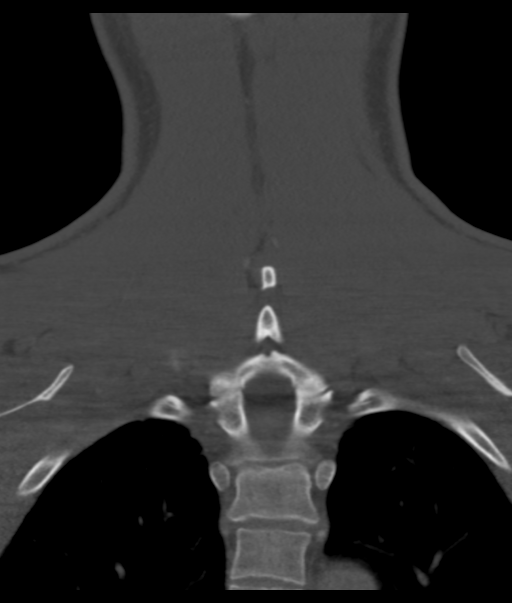
[im 51/126  bone]
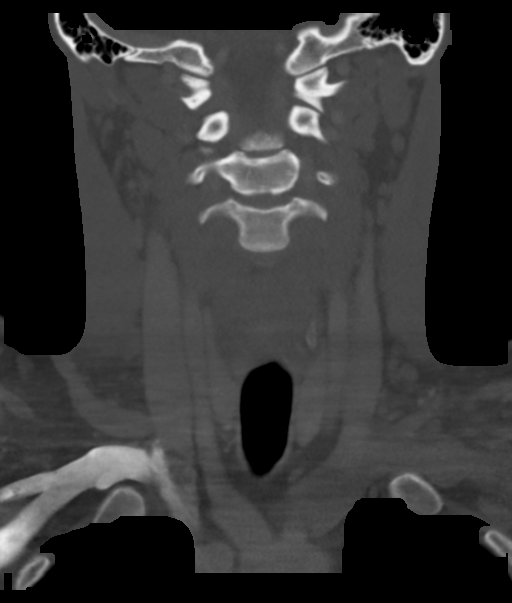
[im 76/126  bone]
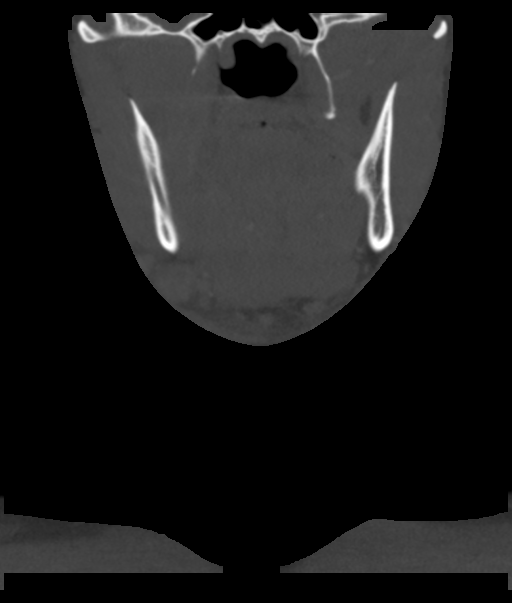

[Series 8: ax oropharynx · axial · 0.39mm/px · 1 of 106 slices shown]
[im 18/106  bone]
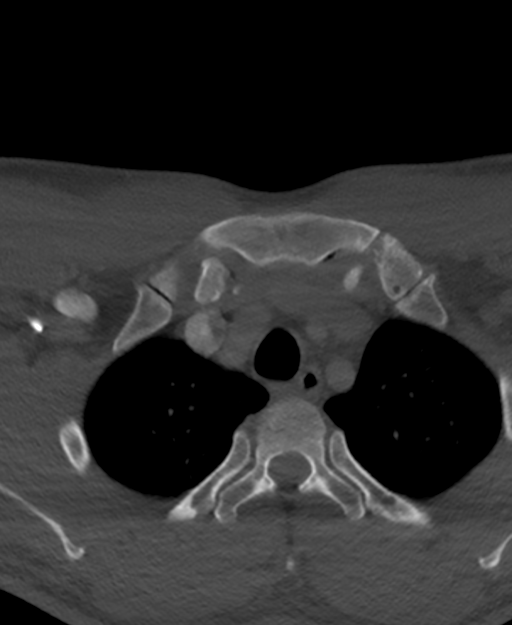

[14 of 33 positions shown; findings below may reference images not displayed]

RADIATION DOSE REDUCTION: This exam was performed according to the
departmental dose-optimization program which includes automated
exposure control, adjustment of the mA and/or kV according to
patient size and/or use of iterative reconstruction technique.

CONTRAST:  75mL OMNIPAQUE IOHEXOL 300 MG/ML  SOLN
FINDINGS: Pharynx and larynx: Soft tissue swelling in the right submandibular
space with low-density edematous appearance exerting mass effect on
the right submandibular gland and floor of mouth. Presumed dental
cause from tooth 31 or 32 which are both eroded with periapical
erosion. Tooth 32 periapical erosion extends more medially. No rim
enhancing collection for drainage. Negative pharynx and larynx

Salivary glands: No primary inflammation suspected.

Thyroid: Normal.

Lymph nodes: Reactive nodal enlargement on the right without
cavitation.

Vascular: Negative.

Limited intracranial: Negative.

Visualized orbits: Negative

Mastoids and visualized paranasal sinuses: Clear

Skeleton: No acute or aggressive finding

Upper chest: Negative
IMPRESSION: Right submandibular space inflammation, presumably odontogenic from
the carious right mandibular molars. Swelling causes mass effect on
the right submandibular gland and floor of mouth; no abscess.

## 2023-04-26 IMAGING — DX DG NECK SOFT TISSUE
2 series · 2 of 2 positions shown · non-contrast
Comparison: No priors.

CLINICAL DATA: 30-year-old male with history of throat pain and
swelling.

EXAM:
NECK SOFT TISSUES - 1+ VIEW

[neck lat]
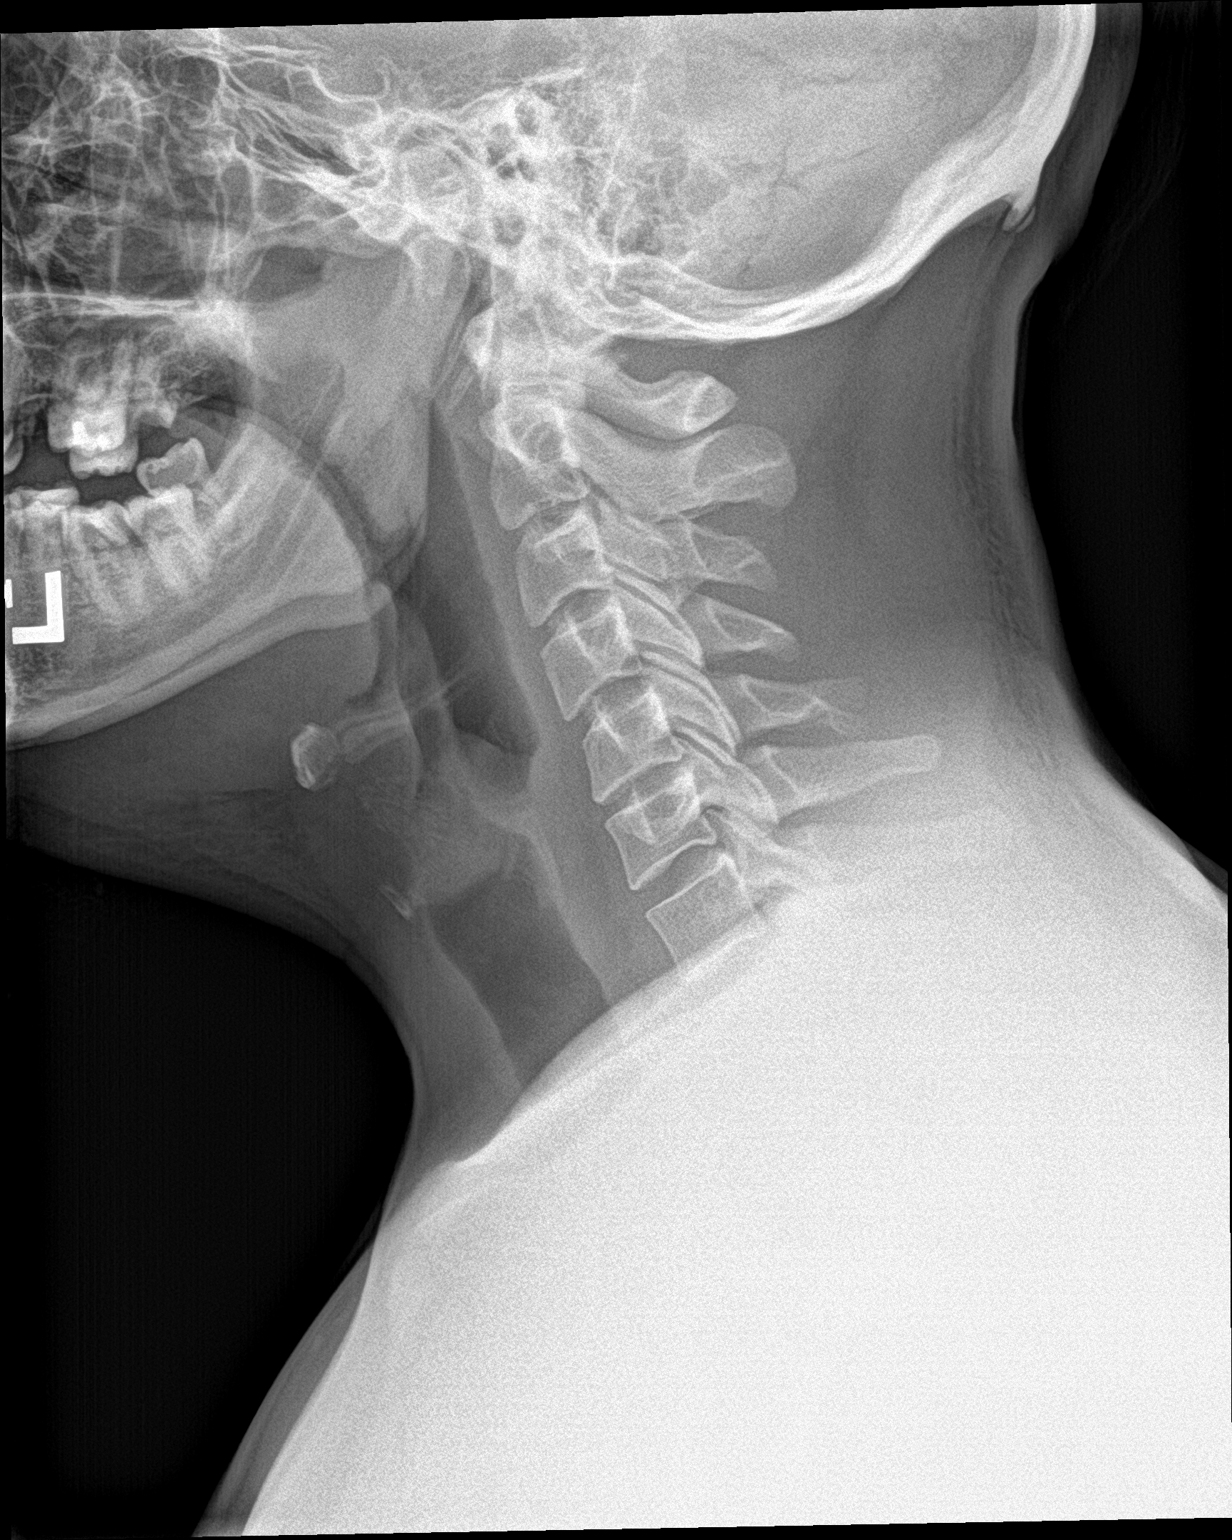

[neck ap]
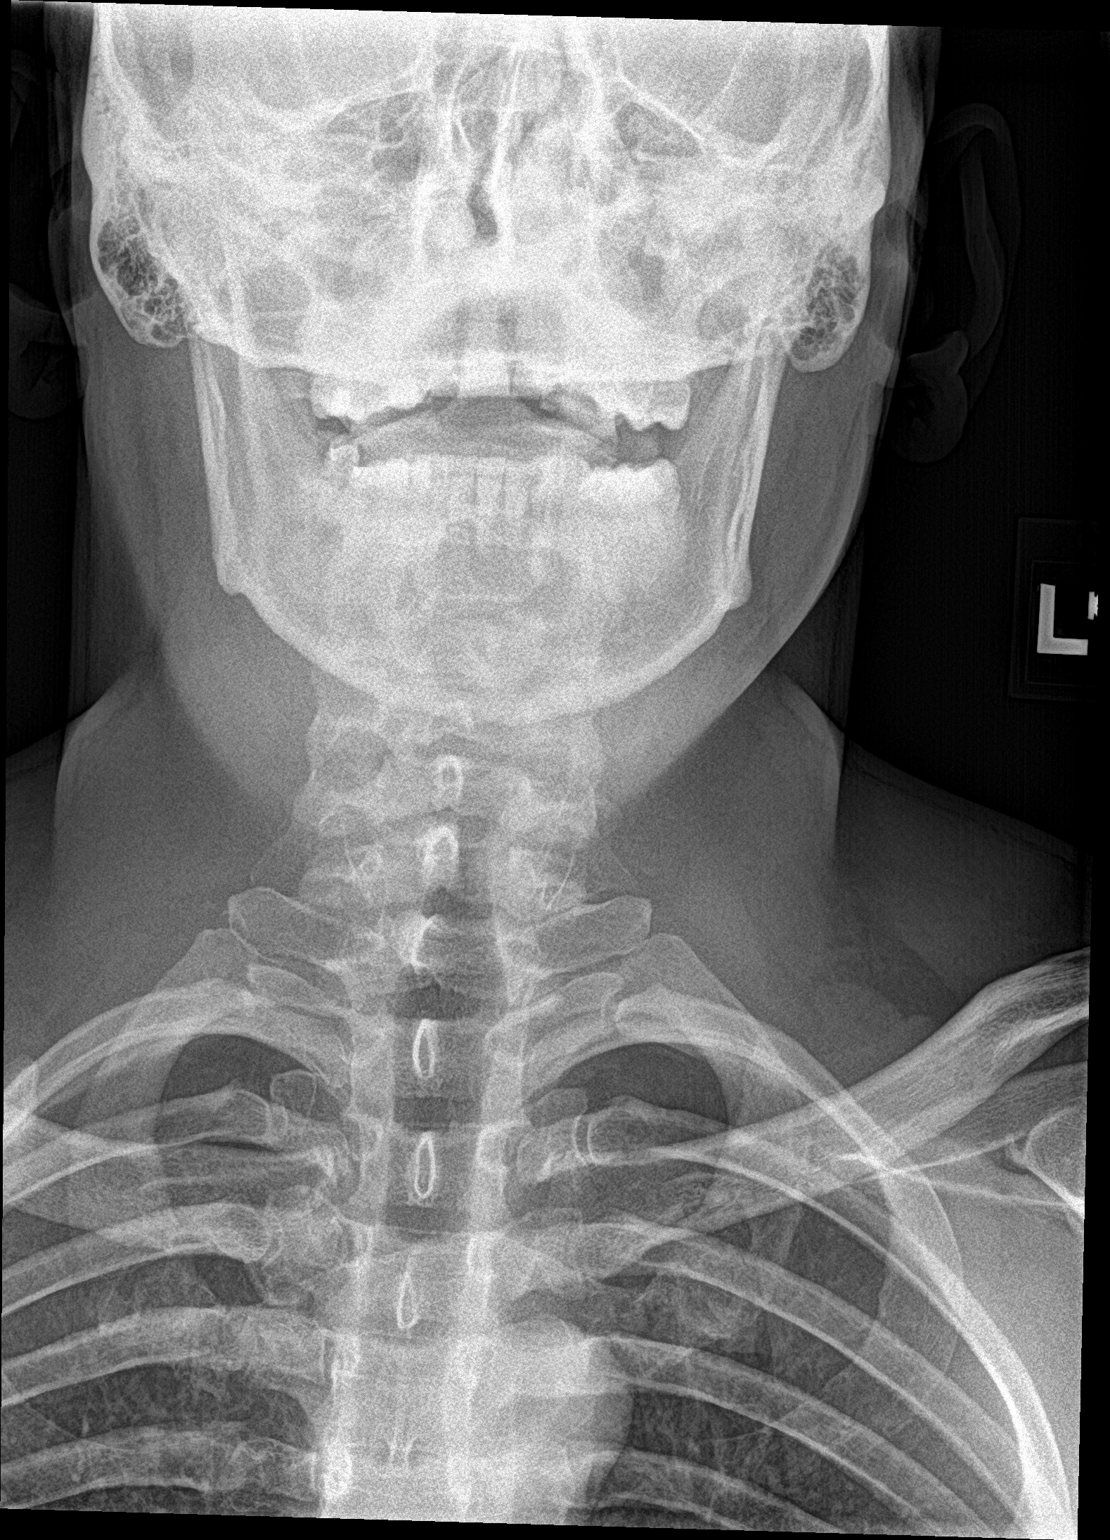

[2 of 2 positions shown; findings below may reference images not displayed]

FINDINGS: Prominence of soft tissues in the posterior oropharynx, presumably
enlarged tonsils/adenoids. There is no evidence of retropharyngeal
soft tissue swelling or epiglottic enlargement. The cervical airway
is unremarkable and no radio-opaque foreign body identified.
IMPRESSION: 1. Prominent soft tissues in the posterior oropharynx which may
reflect enlarged tonsils and/or adenoids.

## 2023-10-14 DEATH — deceased
